# Patient Record
Sex: Female | Born: 2005 | Race: Black or African American | Hispanic: No | Marital: Single | Smoking: Never smoker
Health system: Southern US, Community
[De-identification: ages and names within clinical notes are randomized; demographics above are authoritative.]

## PROBLEM LIST (undated history)

## (undated) DIAGNOSIS — D571 Sickle-cell disease without crisis: Secondary | ICD-10-CM

## (undated) HISTORY — PX: SPLENECTOMY: SUR1306

## (undated) HISTORY — DX: Sickle-cell disease without crisis: D57.1

---
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
## 2023-11-07 ENCOUNTER — Encounter (HOSPITAL_COMMUNITY): Payer: Self-pay

## 2023-11-07 ENCOUNTER — Ambulatory Visit (HOSPITAL_COMMUNITY)
Admission: EM | Admit: 2023-11-07 | Discharge: 2023-11-07 | Disposition: A | Discharge status: Home / Self Care | Attending: Family Medicine | Admitting: Family Medicine

## 2023-11-07 DIAGNOSIS — R102 Pelvic and perineal pain: Secondary | ICD-10-CM | POA: Insufficient documentation

## 2023-11-07 DIAGNOSIS — N760 Acute vaginitis: Secondary | ICD-10-CM | POA: Insufficient documentation

## 2023-11-07 DIAGNOSIS — Z3202 Encounter for pregnancy test, result negative: Secondary | ICD-10-CM | POA: Diagnosis not present

## 2023-11-07 DIAGNOSIS — N390 Urinary tract infection, site not specified: Secondary | ICD-10-CM | POA: Insufficient documentation

## 2023-11-07 DIAGNOSIS — Z202 Contact with and (suspected) exposure to infections with a predominantly sexual mode of transmission: Secondary | ICD-10-CM | POA: Diagnosis not present

## 2023-11-07 LAB — POCT URINALYSIS DIP (MANUAL ENTRY)
Bilirubin, UA: NEGATIVE
Blood, UA: NEGATIVE
Glucose, UA: NEGATIVE mg/dL
Ketones, POC UA: NEGATIVE mg/dL
Nitrite, UA: POSITIVE — AB
Protein Ur, POC: NEGATIVE mg/dL
Spec Grav, UA: 1.01 (ref 1.010–1.025)
Urobilinogen, UA: 0.2 U/dL
pH, UA: 6 (ref 5.0–8.0)

## 2023-11-07 LAB — POCT URINE PREGNANCY: Preg Test, Ur: NEGATIVE

## 2023-11-07 MED ORDER — CEFTRIAXONE SODIUM 500 MG IJ SOLR
500.0000 mg | INTRAMUSCULAR | Status: DC
  Administered 2023-11-07: 500 mg via INTRAMUSCULAR

## 2023-11-07 MED ORDER — AZITHROMYCIN 250 MG PO TABS: ORAL_TABLET | ORAL | 0 refills | Status: DC

## 2023-11-07 MED ORDER — LIDOCAINE HCL (PF) 1 % IJ SOLN
INTRAMUSCULAR | Status: AC
  Filled 2023-11-07: qty 2

## 2023-11-07 MED ORDER — NITROFURANTOIN MONOHYD MACRO 100 MG PO CAPS: 100.0000 mg | ORAL_CAPSULE | Freq: Two times a day (BID) | ORAL | 0 refills | Status: AC

## 2023-11-07 MED ORDER — CEFTRIAXONE SODIUM 500 MG IJ SOLR
INTRAMUSCULAR | Status: AC
  Filled 2023-11-07: qty 500

## 2023-11-07 NOTE — ED Provider Notes (Signed)
 [facility]    CSN: [id number] Arrival date & time: 11/07/23  1134      History   Chief Complaint Chief Complaint  Patient presents with   SEXUALLY TRANSMITTED DISEASE    HPI Marie Pena is a 18 y.o. female.   18 year old female here with complaint of vaginal discharge intermittently since 10/23/2023.  She has been having some lower abdominal pain.  She has chronic upper abdominal pain that she associates with sickle cell anemia.  She has had nausea intermittently since April.  She reports that her husband is Muslim and his faith allows him for wives.  She reports that his second wife called her and her husband and reported that she has chlamydia and that they both need to be treated.     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      Home Medications    Prior to Admission medications   Medication Sig Start Date End Date Taking? Authorizing Provider  azithromycin (ZITHROMAX Z-PAK) 250 MG tablet Azithromycin, 250 mg, number 4 pills now (at 1000 mg dose). 11/07/23  Yes Ival Domino, FNP  nitrofurantoin , macrocrystal-monohydrate, (MACROBID ) 100 MG capsule Take 1 capsule (100 mg total) by mouth 2 (two) times daily for 7 days. 11/07/23 11/14/23 Yes Ival Domino, FNP  acetaminophen  (TYLENOL ) 500 MG tablet Take 1,000 mg by mouth every 6 (six) hours as needed.    [provider]    Family History History reviewed. No pertinent family history.  Social History Social History   Tobacco Use   Smoking status: Never    Passive exposure: Yes   Smokeless tobacco: Never  Vaping Use   Vaping  status: Some Days   Substances: Nicotine, Flavoring  Substance Use Topics   Alcohol use: Yes    Alcohol/week: 0.0 standard drinks of alcohol    Comment: once   Drug use: Yes    Types: Marijuana     Allergies   Patient has no known allergies.   Review of Systems Review of Systems  Constitutional:  Negative for chills and fever.  HENT:  Negative for ear pain and sore throat.   Eyes:  Negative for pain and visual disturbance.  Respiratory:  Negative for cough and shortness of breath.   Cardiovascular:  Negative for chest pain and palpitations.  Gastrointestinal:  Positive for abdominal pain. Negative for constipation, diarrhea, nausea and vomiting.  Genitourinary:  Positive for vaginal discharge. Negative for dysuria and hematuria.  Musculoskeletal:  Negative for arthralgias and back pain.  Skin:  Negative for color change and rash.  Neurological:  Negative for seizures and syncope.  All other systems reviewed and are negative.    Physical Exam Triage Vital Signs ED Triage Vitals  Encounter Vitals Group     BP 11/07/23 1221 127/69     Girls Systolic BP Percentile --      Girls Diastolic BP Percentile --      Boys Systolic BP Percentile --      Boys Diastolic BP Percentile --      Pulse Rate 11/07/23  1221 69     Resp 11/07/23 1221 16     Temp 11/07/23 1221 99 F (37.2 C)     Temp Source 11/07/23 1221 Oral     SpO2 11/07/23 1221 99 %     Weight --      Height --      Head Circumference --      Peak Flow --      Pain Score 11/07/23 1222 0     Pain Loc --      Pain Education --      Exclude from Growth Chart --    No data found.  Updated Vital Signs BP 127/69 (BP Location: Right Arm)   Pulse 69   Temp 99 F (37.2 C) (Oral)   Resp 16   LMP 10/13/2023 (Approximate)   SpO2 99%   Visual Acuity Right Eye Distance:   Left Eye Distance:   Bilateral Distance:    Right Eye Near:   Left Eye Near:    Bilateral Near:     Physical Exam Vitals and nursing note  reviewed.  Constitutional:      General: She is not in acute distress.    Appearance: She is well-developed. She is not ill-appearing or toxic-appearing.  HENT:     Head: Normocephalic and atraumatic.     Right Ear: Hearing, tympanic membrane, ear canal and external ear normal.     Left Ear: Hearing, tympanic membrane, ear canal and external ear normal.     Nose: No congestion or rhinorrhea.     Right Sinus: No maxillary sinus tenderness or frontal sinus tenderness.     Left Sinus: No maxillary sinus tenderness or frontal sinus tenderness.     Mouth/Throat:     Lips: Pink.     Mouth: Mucous membranes are moist.     Pharynx: Uvula midline. No oropharyngeal exudate or posterior oropharyngeal erythema.     Tonsils: No tonsillar exudate.  Eyes:     Conjunctiva/sclera: Conjunctivae normal.     Pupils: Pupils are equal, round, and reactive to light.  Cardiovascular:     Rate and Rhythm: Normal rate and regular rhythm.     Heart sounds: S1 normal and S2 normal. No murmur heard. Pulmonary:     Effort: Pulmonary effort is normal. No respiratory distress.     Breath sounds: Normal breath sounds. No decreased breath sounds, wheezing, rhonchi or rales.  Abdominal:     General: Bowel sounds are normal.     Palpations: Abdomen is soft.     Tenderness: There is abdominal tenderness (Mild upper abdominal pain and mild to moderate suprapubic pain.) in the right upper quadrant, epigastric area, suprapubic area and left upper quadrant.  Genitourinary:    Comments: Internal exam deferred today.  Patient did collect STI self swab. Musculoskeletal:        General: No swelling.     Cervical back: Neck supple.  Lymphadenopathy:     Head:     Right side of head: No submental, submandibular, tonsillar, preauricular or posterior auricular adenopathy.     Left side of head: No submental, submandibular, tonsillar, preauricular or posterior auricular adenopathy.     Cervical: No cervical adenopathy.      Right cervical: No superficial cervical adenopathy.    Left cervical: No superficial cervical adenopathy.  Skin:    General: Skin is warm and dry.     Capillary Refill: Capillary refill takes less than 2 seconds.     Findings: No  rash.  Neurological:     Mental Status: She is alert and oriented to person, place, and time.  Psychiatric:        Mood and Affect: Mood normal.      UC Treatments / Results  Labs (all labs ordered are listed, but only abnormal results are displayed) Labs Reviewed  POCT URINALYSIS DIP (MANUAL ENTRY) - Abnormal; Notable for the following components:      Result Value   Nitrite, UA Positive (*)    Leukocytes, UA Trace (*)    All other components within normal limits  URINE CULTURE  POCT URINE PREGNANCY  CERVICOVAGINAL ANCILLARY ONLY    EKG   Radiology No results found.  Procedures Procedures (including critical care time)  Medications Ordered in UC Medications  cefTRIAXone (ROCEPHIN) injection 500 mg (500 mg Intramuscular Given by Other 11/07/23 1303)    Initial Impression / Assessment and Plan / UC Course  I have reviewed the triage vital signs and the nursing notes.  Pertinent labs & imaging results that were available during my care of the patient were reviewed by me and considered in my medical decision making (see chart for details).  Plan of Care: UTI with suprapubic pain: UA was positive.  Culture sent.  Nitrofurantoin  100 mg twice daily for 7 days.  Will adjust the plan of care if needed once the culture results.  Vaginitis and exposure to chlamydia: STI swab collected.  Ceftriaxone 500 mg injection now.  Azithromycin 250 mg, 4 pills or 1000 mg now.  Will adjust the plan of care, if needed once the STI swab results.  Get plenty of fluids and rest.  Follow-up if symptoms do not improve, worsen or new symptoms occur.  See discharge instructions for more specific patient education.  I reviewed the plan of care with the patient and/or  the patient's guardian.  The patient and/or guardian had time to ask questions and acknowledged that the questions were answered.  I provided instruction on symptoms or reasons to return here or to go to an ER, if symptoms/condition did not improve, worsened or if new symptoms occurred.  Final Clinical Impressions(s) / UC Diagnoses   Final diagnoses:  Suprapubic pain  Exposure to chlamydia  Vaginitis and vulvovaginitis  Urinary tract infection without hematuria, site unspecified     Discharge Instructions      UTI with suprapubic pain: UA is positive.  Nitrofurantoin  100 mg twice daily for 7 days.  Urine culture sent and will adjust the plan of care, if needed once culture results.  Vaginitis and exposure to chlamydia: STI swab collected and sent.  Ceftriaxone 500 mg injection now.  (This is an antibiotic).  Azithromycin, 250 mg, 4 pills or 1000 mg now.  Will adjust the plan of care, once the STI swab results, if needed.  Instructed no intercourse at all for 7 days.  And when she resumes intercourse she should use condoms.  Her husband should also get treated.  Follow-up if symptoms do not improve, worsen or new symptoms occur.     ED Prescriptions     Medication Sig Dispense Auth. Provider   azithromycin (ZITHROMAX Z-PAK) 250 MG tablet Azithromycin, 250 mg, number 4 pills now (at 1000 mg dose). 4 tablet Jakai Risse, FNP   nitrofurantoin , macrocrystal-monohydrate, (MACROBID ) 100 MG capsule Take 1 capsule (100 mg total) by mouth 2 (two) times daily for 7 days. 14 capsule Ival Domino, FNP      PDMP not reviewed this encounter.  Ival Domino, FNP 11/07/23 1308

## 2023-11-07 NOTE — Discharge Instructions (Signed)
 UTI with suprapubic pain: UA is positive.  Nitrofurantoin  100 mg twice daily for 7 days.  Urine culture sent and will adjust the plan of care, if needed once culture results.  Vaginitis and exposure to chlamydia: STI swab collected and sent.  Ceftriaxone 500 mg injection now.  (This is an antibiotic).  Azithromycin, 250 mg, 4 pills or 1000 mg now.  Will adjust the plan of care, once the STI swab results, if needed.  Instructed no intercourse at all for 7 days.  And when she resumes intercourse she should use condoms.  Her husband should also get treated.  Follow-up if symptoms do not improve, worsen or new symptoms occur.

## 2023-11-07 NOTE — ED Triage Notes (Signed)
 Patient here today with c/o vaginal discharge X 2 weeks. Patient has also been having some abd pain and nausea X 2-3 months. Patient has sickle cell. Patient was told by her husband's future second wife that she has Chlamydia.

## 2023-11-10 LAB — URINE CULTURE: Culture: 90000 — AB

## 2023-11-11 ENCOUNTER — Ambulatory Visit (HOSPITAL_COMMUNITY): Payer: Self-pay

## 2023-11-11 LAB — CERVICOVAGINAL ANCILLARY ONLY
Bacterial Vaginitis (gardnerella): POSITIVE — AB
Candida Glabrata: NEGATIVE
Candida Vaginitis: POSITIVE — AB
Chlamydia: POSITIVE — AB
Comment: NEGATIVE
Comment: NEGATIVE
Comment: NEGATIVE
Comment: NEGATIVE
Comment: NEGATIVE
Comment: NORMAL
Neisseria Gonorrhea: NEGATIVE
Trichomonas: NEGATIVE

## 2023-11-12 MED ORDER — METRONIDAZOLE 0.75 % VA GEL: 1.0000 | Freq: Every day | VAGINAL | 0 refills | Status: AC

## 2023-11-12 MED ORDER — FLUCONAZOLE 150 MG PO TABS: 150.0000 mg | ORAL_TABLET | Freq: Once | ORAL | 0 refills | Status: AC

## 2023-11-12 MED ORDER — DOXYCYCLINE HYCLATE 100 MG PO TABS: 100.0000 mg | ORAL_TABLET | Freq: Two times a day (BID) | ORAL | 0 refills | Status: AC

## 2024-01-07 ENCOUNTER — Emergency Department (HOSPITAL_COMMUNITY)
Admission: EM | Admit: 2024-01-07 | Discharge: 2024-01-07 | Disposition: A | Discharge status: Home / Self Care | Attending: Emergency Medicine | Admitting: Emergency Medicine

## 2024-01-07 ENCOUNTER — Emergency Department (HOSPITAL_COMMUNITY)

## 2024-01-07 ENCOUNTER — Other Ambulatory Visit: Payer: Self-pay

## 2024-01-07 ENCOUNTER — Encounter (HOSPITAL_COMMUNITY): Payer: Self-pay

## 2024-01-07 DIAGNOSIS — N83202 Unspecified ovarian cyst, left side: Secondary | ICD-10-CM | POA: Diagnosis not present

## 2024-01-07 DIAGNOSIS — N83201 Unspecified ovarian cyst, right side: Secondary | ICD-10-CM | POA: Diagnosis not present

## 2024-01-07 DIAGNOSIS — N83209 Unspecified ovarian cyst, unspecified side: Secondary | ICD-10-CM

## 2024-01-07 DIAGNOSIS — R9389 Abnormal findings on diagnostic imaging of other specified body structures: Secondary | ICD-10-CM | POA: Diagnosis not present

## 2024-01-07 DIAGNOSIS — R102 Pelvic and perineal pain: Secondary | ICD-10-CM | POA: Diagnosis not present

## 2024-01-07 DIAGNOSIS — K7689 Other specified diseases of liver: Secondary | ICD-10-CM | POA: Diagnosis not present

## 2024-01-07 DIAGNOSIS — R1030 Lower abdominal pain, unspecified: Secondary | ICD-10-CM | POA: Diagnosis present

## 2024-01-07 DIAGNOSIS — Z9081 Acquired absence of spleen: Secondary | ICD-10-CM | POA: Diagnosis not present

## 2024-01-07 DIAGNOSIS — E282 Polycystic ovarian syndrome: Secondary | ICD-10-CM | POA: Diagnosis not present

## 2024-01-07 LAB — CBC WITH DIFFERENTIAL/PLATELET
Basophils Absolute: 0.2 K/uL — ABNORMAL HIGH (ref 0.0–0.1)
Basophils Relative: 1 %
Eosinophils Absolute: 0.7 K/uL — ABNORMAL HIGH (ref 0.0–0.5)
Eosinophils Relative: 4 %
HCT: 30.9 % — ABNORMAL LOW (ref 36.0–46.0)
Hemoglobin: 11.5 g/dL — ABNORMAL LOW (ref 12.0–15.0)
Lymphocytes Relative: 74 %
Lymphs Abs: 12.5 K/uL — ABNORMAL HIGH (ref 0.7–4.0)
MCH: 32.2 pg (ref 26.0–34.0)
MCHC: 37.2 g/dL — ABNORMAL HIGH (ref 30.0–36.0)
MCV: 86.6 fL (ref 80.0–100.0)
Monocytes Absolute: 0.7 K/uL (ref 0.1–1.0)
Monocytes Relative: 4 %
Neutro Abs: 2.9 K/uL (ref 1.7–7.7)
Neutrophils Relative %: 17 %
Platelets: 390 K/uL (ref 150–400)
RBC: 3.57 MIL/uL — ABNORMAL LOW (ref 3.87–5.11)
RDW: 13.9 % (ref 11.5–15.5)
WBC: 16.9 K/uL — ABNORMAL HIGH (ref 4.0–10.5)
nRBC: 1.2 % — ABNORMAL HIGH (ref 0.0–0.2)

## 2024-01-07 LAB — RETICULOCYTES
Immature Retic Fract: 28.8 % — ABNORMAL HIGH (ref 2.3–15.9)
RBC.: 3.45 MIL/uL — ABNORMAL LOW (ref 3.87–5.11)
Retic Count, Absolute: 148.5 K/uL (ref 19.0–186.0)
Retic Ct Pct: 4.3 % — ABNORMAL HIGH (ref 0.4–3.1)

## 2024-01-07 LAB — URINALYSIS, ROUTINE W REFLEX MICROSCOPIC
Bilirubin Urine: NEGATIVE
Glucose, UA: NEGATIVE mg/dL
Hgb urine dipstick: NEGATIVE
Ketones, ur: 5 mg/dL — AB
Leukocytes,Ua: NEGATIVE
Nitrite: NEGATIVE
Protein, ur: NEGATIVE mg/dL
Specific Gravity, Urine: >1.046 — ABNORMAL HIGH (ref 1.005–1.030)
pH: 7 (ref 5.0–8.0)

## 2024-01-07 LAB — COMPREHENSIVE METABOLIC PANEL WITH GFR
ALT: 10 U/L (ref 0–44)
AST: 16 U/L (ref 15–41)
Albumin: 4.1 g/dL (ref 3.5–5.0)
Alkaline Phosphatase: 53 U/L (ref 38–126)
Anion gap: 9 (ref 5–15)
BUN: <5 mg/dL — ABNORMAL LOW (ref 6–20)
CO2: 26 mmol/L (ref 22–32)
Calcium: 9 mg/dL (ref 8.9–10.3)
Chloride: 104 mmol/L (ref 98–111)
Creatinine, Ser: 0.66 mg/dL (ref 0.44–1.00)
GFR, Estimated: >60 mL/min (ref >60)
Glucose, Bld: 101 mg/dL — ABNORMAL HIGH (ref 70–99)
Potassium: 3.3 mmol/L — ABNORMAL LOW (ref 3.5–5.1)
Sodium: 139 mmol/L (ref 135–145)
Total Bilirubin: 2.5 mg/dL — ABNORMAL HIGH (ref 0.0–1.2)
Total Protein: 6.8 g/dL (ref 6.5–8.1)

## 2024-01-07 LAB — HCG, SERUM, QUALITATIVE: Preg, Serum: NEGATIVE

## 2024-01-07 MED ORDER — ONDANSETRON HCL 4 MG/2ML IJ SOLN
4.0000 mg | Freq: Once | INTRAMUSCULAR | Status: AC
  Administered 2024-01-07: 4 mg via INTRAVENOUS
  Filled 2024-01-07: qty 2

## 2024-01-07 MED ORDER — LACTATED RINGERS IV BOLUS
1000.0000 mL | Freq: Once | INTRAVENOUS | Status: AC
  Administered 2024-01-07: 1000 mL via INTRAVENOUS

## 2024-01-07 MED ORDER — IOHEXOL 350 MG/ML SOLN
75.0000 mL | Freq: Once | INTRAVENOUS | Status: AC | PRN
  Administered 2024-01-07: 75 mL via INTRAVENOUS

## 2024-01-07 MED ORDER — HYDROMORPHONE HCL 1 MG/ML IJ SOLN: 1.0000 mg | Freq: Once | INTRAMUSCULAR | Status: DC

## 2024-01-07 MED ORDER — HYDROMORPHONE HCL 1 MG/ML IJ SOLN
1.0000 mg | Freq: Once | INTRAMUSCULAR | Status: AC
  Administered 2024-01-07: 1 mg via INTRAVENOUS
  Filled 2024-01-07: qty 1

## 2024-01-07 MED ORDER — KETOROLAC TROMETHAMINE 15 MG/ML IJ SOLN
15.0000 mg | Freq: Once | INTRAMUSCULAR | Status: AC
  Administered 2024-01-07: 15 mg via INTRAVENOUS
  Filled 2024-01-07: qty 1

## 2024-01-07 NOTE — ED Notes (Signed)
 Patient transported to CT

## 2024-01-07 NOTE — ED Provider Notes (Signed)
 Patient signed out to me from [facility]. Here with pelvic pain after sexual intercourse. CT scan showing involuting ovarian cyst. Pelvic ultrasound pending to rule out torsion.  Physical Exam  BP 106/64   Pulse (!) 57   Temp 97.6 F (36.4 C)   Resp 14   Ht 5' 7 (1.702 m)   Wt 61.2 kg   LMP 11/05/2023 (Approximate)   SpO2 95%   BMI 21.13 kg/m   Physical Exam  Procedures  Procedures  ED Course / MDM   Clinical Course as of 01/07/24 0808  Tue Jan 07, 2024  0632 Pelvic pain during intercourse. Ovarian cyst on CT. US  to r/o torsion. DC if US  ok.  [JR]    Clinical Course User Index [JR] Lang Norleen POUR, PA-C   Medical Decision Making Amount and/or Complexity of Data Reviewed Labs: ordered. Radiology: ordered.  Risk Prescription drug management.    Ultrasound negative for torsion.  On reassessment pain is well-controlled and patient is well-appearing and hemodynamically stable.  Advised to follow-up with her OB/GYN.  Encouraged NSAIDs and supportive care at home.  Discussed return precautions.  Discharged.      Lang Norleen POUR, PA-C 01/07/24 0815    Cottie Donnice PARAS, MD 01/07/24 [phone]

## 2024-01-07 NOTE — ED Notes (Signed)
 Patient transported to Ultrasound

## 2024-01-07 NOTE — Discharge Instructions (Addendum)
 Evaluation today revealed you have an ovarian cyst this is likely contributing to your pain.  Recommend ibuprofen  and warm compresses and follow-up with OB/GYN.  If you have abnormal vaginal bleeding, worsening abdominal pain develop a fever or any other concerning symptom please return to ED for further evaluation.

## 2024-01-07 NOTE — ED Triage Notes (Signed)
 Pt arrived from home via POV c/o severe abd pain 10/10 following sexual activity. Pt noted to have sickle cell.

## 2024-01-07 NOTE — ED Notes (Signed)
Patient states she is unable to provide urine sample at this time.

## 2024-03-02 NOTE — ED Provider Notes (Signed)
 [facility] Emergency Department Provider Note MRN:  [id number]  Arrival date & time: 03/02/24     Chief Complaint   Abdominal Pain and Sickle Cell Pain Crisis   History of Present Illness   Marie Pena is a 18 y.o. year-old female presents to the ED with chief complaint of lower abdominal pain.  States that it is associated with intercourse.  She has hx of sickle cell.SABRA  History provided by patient.   Review of Systems  Pertinent positive and negative review of systems noted in HPI.    Physical Exam   Vitals:   01/07/24 0315 01/07/24 0825  BP: 106/64 105/78  Pulse: (!) 57 83  Resp: 14 16  Temp:  97.6 F (36.4 C)  SpO2: 95% 100%    CONSTITUTIONAL:  non toxic-appearing, NAD NEURO:  Alert and oriented x 3, CN 3-12 grossly intact EYES:  eyes equal and reactive ENT/NECK:  Supple, no stridor  CARDIO:  normal rate, regular rhythm, appears well-perfused  PULM:  No respiratory distress,  GI/GU:  non-distended, generalized discomfort MSK/SPINE:  No gross deformities, no edema, moves all extremities  SKIN:  no rash, atraumatic   *Additional and/or pertinent findings included in MDM below  Diagnostic and Interventional Summary    EKG Interpretation Date/Time:    Ventricular Rate:    PR Interval:    QRS Duration:    QT Interval:    QTC Calculation:   R Axis:      Text Interpretation:         Labs Reviewed  COMPREHENSIVE METABOLIC PANEL WITH GFR - Abnormal; Notable for the following components:      Result Value   Potassium 3.3 (*)    Glucose, Bld 101 (*)    BUN <5 (*)    Total Bilirubin 2.5 (*)    All other components within normal limits  CBC WITH DIFFERENTIAL/PLATELET - Abnormal; Notable for the following components:   WBC 16.9 (*)    RBC 3.57 (*)    Hemoglobin 11.5 (*)    HCT 30.9 (*)    MCHC 37.2 (*)    nRBC 1.2 (*)    Lymphs Abs 12.5 (*)    Eosinophils Absolute 0.7 (*)    Basophils Absolute 0.2 (*)    All other  components within normal limits  RETICULOCYTES - Abnormal; Notable for the following components:   Retic Ct Pct 4.3 (*)    RBC. 3.45 (*)    Immature Retic Fract 28.8 (*)    All other components within normal limits  URINALYSIS, ROUTINE W REFLEX MICROSCOPIC - Abnormal; Notable for the following components:   Specific Gravity, Urine >1.046 (*)    Ketones, ur 5 (*)    All other components within normal limits  HCG, SERUM, QUALITATIVE    US  Pelvis Complete  Final Result    US  Transvaginal Non-OB  Final Result    US  Art/Ven Flow Abd Pelv Doppler  Final Result    CT ABDOMEN PELVIS W CONTRAST  Final Result      Medications  HYDROmorphone  (DILAUDID ) injection 1 mg (1 mg Intravenous Given 01/07/24 0228)  iohexol  (OMNIPAQUE ) 350 MG/ML injection 75 mL (75 mLs Intravenous Contrast Given 01/07/24 0345)  ondansetron  (ZOFRAN ) injection 4 mg (4 mg Intravenous Given 01/07/24 0353)  lactated ringers  bolus 1,000 mL (0 mLs Intravenous Stopped 01/07/24 0530)  ketorolac  (TORADOL ) 15 MG/ML injection 15 mg (15 mg Intravenous Given 01/07/24 0826)     Procedures  /  Critical Care  Procedures  ED Course and Medical Decision Making  I have reviewed the triage vital signs, the nursing notes, and pertinent available records from the EMR.  Social Determinants Affecting Complexity of Care: Patient has no clinically significant social determinants affecting this chief complaint..   ED Course: Clinical Course as of 03/02/24 2201  Tue Jan 07, 2024  0632 Pelvic pain during intercourse. Ovarian cyst on CT. US  to r/o torsion. DC if US  ok.  [JR]    Clinical Course User Index [JR] Lang Norleen POUR, PA-C    Medical Decision Making Amount and/or Complexity of Data Reviewed Labs: ordered. Radiology: ordered.  Risk Prescription drug management.         Consultants: No consultations were needed in caring for this patient.   Treatment and Plan: Signed out to oncoming team.    Final Clinical  Impressions(s) / ED Diagnoses     ICD-10-CM   1. Cyst of ovary, unspecified laterality  N83.209       ED Discharge Orders     None         Discharge Instructions Discussed with and Provided to Patient:    Discharge Instructions      Evaluation today revealed you have an ovarian cyst this is likely contributing to your pain.  Recommend ibuprofen  and warm compresses and follow-up with OB/GYN.  If you have abnormal vaginal bleeding, worsening abdominal pain develop a fever or any other concerning symptom please return to ED for further evaluation.      [name], PA-C 03/02/24 2223    [name], MD 03/03/24 [phone]

## 2024-05-09 ENCOUNTER — Ambulatory Visit (HOSPITAL_COMMUNITY): Admission: EM | Admit: 2024-05-09 | Discharge: 2024-05-09 | Disposition: A | Discharge status: Home / Self Care

## 2024-05-09 ENCOUNTER — Encounter (HOSPITAL_COMMUNITY): Payer: Self-pay | Admitting: Emergency Medicine

## 2024-05-09 DIAGNOSIS — Z113 Encounter for screening for infections with a predominantly sexual mode of transmission: Secondary | ICD-10-CM | POA: Diagnosis not present

## 2024-05-09 LAB — HIV ANTIBODY (ROUTINE TESTING W REFLEX): HIV Screen 4th Generation wRfx: NONREACTIVE

## 2024-05-09 LAB — POCT URINE PREGNANCY: Preg Test, Ur: NEGATIVE

## 2024-05-09 NOTE — ED Triage Notes (Signed)
 Pt reports that she had exposure to chlamydia. Reports just dont feel right down there.

## 2024-05-09 NOTE — ED Provider Notes (Signed)
 " [facility]  Note:  This document was prepared using Dragon voice recognition software and may include unintentional dictation errors.  MRN: [id number] DOB: Nov 17, 2005  Subjective:   Marie Pena is a 19 y.o. female presenting for STD screening due to exposure to her boyfriend who tested positive for chlamydia last week.  Patient reports that she does not have any vaginal discharge or lesions but states that things do not feel right down there.  Patient denies any dysuria, increased urinary frequency, abdominal pain or flank pain.  Current Medications[1]   Allergies[2]  Past Medical History:  Diagnosis Date   Sickle cell anemia (HCC)    Houstonia type     Past Surgical History:  Procedure Laterality Date   SPLENECTOMY      No family history on file.  Social History[3]  ROS Refer to HPI for ROS details.  Objective:    Vitals: BP 114/71 (BP Location: Right Arm)   Pulse 73   Temp 98.3 F (36.8 C) (Oral)   Resp 17   LMP 04/28/2024 (Approximate)   SpO2 99%   Physical Exam Vitals and nursing note reviewed.  Constitutional:      General: She is not in acute distress.    Appearance: Normal appearance. She is well-developed. She is not ill-appearing or toxic-appearing.  HENT:     Head: Normocephalic and atraumatic.  Cardiovascular:     Rate and Rhythm: Normal rate.  Pulmonary:     Effort: Pulmonary effort is normal. No respiratory distress.     Breath sounds: No stridor. No wheezing.  Abdominal:     Tenderness: There is no abdominal tenderness. There is no right CVA tenderness or left CVA tenderness.  Genitourinary:    Vagina: No vaginal discharge.  Skin:    General: Skin is warm and dry.  Neurological:     General: No focal deficit present.     Mental Status: She is alert and oriented to person, place, and time.  Psychiatric:        Mood and Affect: Mood normal.        Behavior: Behavior normal.     Procedures  Results for orders  placed or performed during the hospital encounter of 05/09/24 (from the past 24 hours)  POCT urine pregnancy     Status: None   Collection Time: 05/09/24  7:14 PM  Result Value Ref Range   Preg Test, Ur Negative Negative    Assessment and Plan :     Discharge Instructions       1. Screening for STD (sexually transmitted disease) (Primary) - POCT urine pregnancy completed UC is negative for pregnancy - Serum RPR and HIV testing collected in UC and sent to lab for further testing results should be available in 1 to 2 days - Cervicovaginal swab collected in UC and sent to lab for further testing results should be available in 2 to 3 days. - If any abnormal findings with final report patient will be contacted appropriate treatment provided - Continue to monitor for any symptoms if you develop any symptoms of STD or any other concerning symptoms follow-up in ER for further evaluation and management.       Kyomi Hector B Louetta Hollingshead    [1] No current facility-administered medications for this encounter.  Current Outpatient Medications:    acetaminophen  (TYLENOL ) 500 MG tablet, Take 1,000 mg by mouth every 6 (six) hours as needed., Disp: , Rfl:  [2] No Known Allergies [3]  Social History Tobacco  Use   Smoking status: Never    Passive exposure: Yes   Smokeless tobacco: Never  Vaping Use   Vaping status: Some Days   Substances: Nicotine, Flavoring  Substance Use Topics   Alcohol use: Yes    Alcohol/week: 0.0 standard drinks of alcohol    Comment: once   Drug use: Yes    Types: Marijuana     Aurea Goodell B, NP 05/09/24 1932  "

## 2024-05-09 NOTE — Discharge Instructions (Addendum)
" °  1. Screening for STD (sexually transmitted disease) (Primary) - POCT urine pregnancy completed UC is negative for pregnancy - Serum RPR and HIV testing collected in UC and sent to lab for further testing results should be available in 1 to 2 days - Cervicovaginal swab collected in UC and sent to lab for further testing results should be available in 2 to 3 days. - If any abnormal findings with final report patient will be contacted appropriate treatment provided - Continue to monitor for any symptoms if you develop any symptoms of STD or any other concerning symptoms follow-up in ER for further evaluation and management.  "

## 2024-05-10 LAB — SYPHILIS: RPR W/REFLEX TO RPR TITER AND TREPONEMAL ANTIBODIES, TRADITIONAL SCREENING AND DIAGNOSIS ALGORITHM: RPR Ser Ql: NONREACTIVE

## 2024-05-11 LAB — CERVICOVAGINAL ANCILLARY ONLY
Bacterial Vaginitis (gardnerella): POSITIVE — AB
Candida Glabrata: NEGATIVE
Candida Vaginitis: POSITIVE — AB
Chlamydia: NEGATIVE
Comment: NEGATIVE
Comment: NEGATIVE
Comment: NEGATIVE
Comment: NEGATIVE
Comment: NEGATIVE
Comment: NORMAL
Neisseria Gonorrhea: NEGATIVE
Trichomonas: NEGATIVE

## 2024-05-12 ENCOUNTER — Ambulatory Visit (HOSPITAL_COMMUNITY): Payer: Self-pay

## 2024-05-12 MED ORDER — FLUCONAZOLE 150 MG PO TABS: 150.0000 mg | ORAL_TABLET | Freq: Once | ORAL | 0 refills | Status: AC

## 2024-05-12 MED ORDER — METRONIDAZOLE 500 MG PO TABS: 500.0000 mg | ORAL_TABLET | Freq: Two times a day (BID) | ORAL | 0 refills | Status: AC
